# Patient Record
Sex: Male | Born: 1955 | Race: White | Hispanic: No | Marital: Married | Smoking: Never smoker
Health system: Southern US, Community
[De-identification: ages and names within clinical notes are randomized; demographics above are authoritative.]

## PROBLEM LIST (undated history)

## (undated) HISTORY — PX: SHOULDER ARTHROSCOPY: SHX128

## (undated) HISTORY — PX: BACK SURGERY: SHX140

## (undated) HISTORY — PX: FRACTURE SURGERY: SHX138

---

## 2016-03-14 ENCOUNTER — Encounter: Payer: Self-pay | Admitting: Emergency Medicine

## 2016-03-14 ENCOUNTER — Emergency Department
Admission: EM | Admit: 2016-03-14 | Discharge: 2016-03-14 | Disposition: A | Discharge status: Home / Self Care | Payer: Managed Care, Other (non HMO) | Attending: Emergency Medicine | Admitting: Emergency Medicine

## 2016-03-14 ENCOUNTER — Ambulatory Visit (INDEPENDENT_AMBULATORY_CARE_PROVIDER_SITE_OTHER): Payer: Managed Care, Other (non HMO)

## 2016-03-14 ENCOUNTER — Ambulatory Visit (INDEPENDENT_AMBULATORY_CARE_PROVIDER_SITE_OTHER)
Admission: EM | Admit: 2016-03-14 | Discharge: 2016-03-14 | Disposition: A | Discharge status: Home / Self Care | Payer: Managed Care, Other (non HMO) | Source: Home / Self Care | Attending: Emergency Medicine | Admitting: Emergency Medicine

## 2016-03-14 DIAGNOSIS — F10230 Alcohol dependence with withdrawal, uncomplicated: Secondary | ICD-10-CM | POA: Diagnosis not present

## 2016-03-14 DIAGNOSIS — M5137 Other intervertebral disc degeneration, lumbosacral region: Secondary | ICD-10-CM

## 2016-03-14 DIAGNOSIS — D696 Thrombocytopenia, unspecified: Secondary | ICD-10-CM

## 2016-03-14 DIAGNOSIS — F10930 Alcohol use, unspecified with withdrawal, uncomplicated: Secondary | ICD-10-CM

## 2016-03-14 DIAGNOSIS — I159 Secondary hypertension, unspecified: Secondary | ICD-10-CM

## 2016-03-14 DIAGNOSIS — K709 Alcoholic liver disease, unspecified: Secondary | ICD-10-CM | POA: Diagnosis not present

## 2016-03-14 DIAGNOSIS — K7010 Alcoholic hepatitis without ascites: Secondary | ICD-10-CM | POA: Insufficient documentation

## 2016-03-14 DIAGNOSIS — R202 Paresthesia of skin: Secondary | ICD-10-CM | POA: Diagnosis present

## 2016-03-14 LAB — CBC WITH DIFFERENTIAL/PLATELET
Basophils Absolute: 0 10*3/uL (ref 0–0.1)
Basophils Relative: 0 %
Eosinophils Absolute: 0.1 10*3/uL (ref 0–0.7)
Eosinophils Relative: 1 %
HCT: 47.6 % (ref 40.0–52.0)
Hemoglobin: 16.2 g/dL (ref 13.0–18.0)
Lymphocytes Relative: 10 %
Lymphs Abs: 0.8 10*3/uL — ABNORMAL LOW (ref 1.0–3.6)
MCH: 34.1 pg — ABNORMAL HIGH (ref 26.0–34.0)
MCHC: 34 g/dL (ref 32.0–36.0)
MCV: 100 fL (ref 80.0–100.0)
Monocytes Absolute: 0.7 10*3/uL (ref 0.2–1.0)
Monocytes Relative: 8 %
Neutro Abs: 6.9 10*3/uL — ABNORMAL HIGH (ref 1.4–6.5)
Neutrophils Relative %: 81 %
Platelets: 104 10*3/uL — ABNORMAL LOW (ref 150–440)
RBC: 4.76 MIL/uL (ref 4.40–5.90)
RDW: 12.9 % (ref 11.5–14.5)
WBC: 8.5 10*3/uL (ref 3.8–10.6)

## 2016-03-14 LAB — COMPREHENSIVE METABOLIC PANEL
ALT: 308 U/L — ABNORMAL HIGH (ref 17–63)
AST: 593 U/L — ABNORMAL HIGH (ref 15–41)
Albumin: 4.9 g/dL (ref 3.5–5.0)
Alkaline Phosphatase: 94 U/L (ref 38–126)
Anion gap: 12 (ref 5–15)
BUN: 13 mg/dL (ref 6–20)
CO2: 24 mmol/L (ref 22–32)
Calcium: 9.9 mg/dL (ref 8.9–10.3)
Chloride: 97 mmol/L — ABNORMAL LOW (ref 101–111)
Creatinine, Ser: 0.78 mg/dL (ref 0.61–1.24)
GFR calc Af Amer: >60 mL/min (ref >60)
GFR calc non Af Amer: >60 mL/min (ref >60)
Glucose, Bld: 132 mg/dL — ABNORMAL HIGH (ref 65–99)
Potassium: 3.6 mmol/L (ref 3.5–5.1)
Sodium: 133 mmol/L — ABNORMAL LOW (ref 135–145)
Total Bilirubin: 3 mg/dL — ABNORMAL HIGH (ref 0.3–1.2)
Total Protein: 9.4 g/dL — ABNORMAL HIGH (ref 6.5–8.1)

## 2016-03-14 LAB — SEDIMENTATION RATE: Sed Rate: 2 mm/hr (ref 0–20)

## 2016-03-14 MED ORDER — KETOROLAC TROMETHAMINE 60 MG/2ML IM SOLN
60.0000 mg | Freq: Once | INTRAMUSCULAR | Status: AC
  Administered 2016-03-14: 60 mg via INTRAMUSCULAR

## 2016-03-14 MED ORDER — SODIUM CHLORIDE 0.9 % IV SOLN
1000.0000 mL | Freq: Once | INTRAVENOUS | Status: AC
  Administered 2016-03-14: 1000 mL via INTRAVENOUS

## 2016-03-14 MED ORDER — LORAZEPAM 1 MG PO TABS: 1.0000 mg | ORAL_TABLET | Freq: Three times a day (TID) | ORAL | Status: AC | PRN

## 2016-03-14 MED ORDER — LORAZEPAM 2 MG/ML IJ SOLN
1.0000 mg | Freq: Once | INTRAMUSCULAR | Status: AC
  Administered 2016-03-14: 1 mg via INTRAVENOUS
  Filled 2016-03-14: qty 1

## 2016-03-14 NOTE — ED Notes (Signed)
Pt states he went to the [facility] urgent care today and was seen for back pain and BL LE pain/tingling.. States his liver enzymes where elevated and was referred to the ED for possible admission.. Pt is here with his wife who states they discussed pt has a problem with alcohol and wants help.. States drinks 6-12 or more beers/day.Mario Harris..Mario Harris

## 2016-03-14 NOTE — ED Provider Notes (Signed)
[facility] Emergency Department Provider Note  ____________________________________________    I have reviewed the triage vital signs and the nursing notes.   HISTORY  Chief Complaint Abnormal Lab and Alcohol Problem    HPI Kayshawn Yero is a 60 y.o. male who was sent from urgent care for elevated liver enzymes. Patient reports he was in the car today and felt shaky, he stopped that urgent care and they checked his labs and discovered elevated AST and ALT. Patient admits to drinking 6-12 beers per night for some time. No edema or abdominal swelling. No fever or chills nausea or vomiting or abdominal pain. Patient does request resources for his alcohol detox.   History reviewed. No pertinent past medical history.  There are no active problems to display for this patient.   Past Surgical History  Procedure Laterality Date  . Back surgery    . Shoulder arthroscopy    . Fracture surgery      No current outpatient prescriptions on file.  Allergies Review of patient's allergies indicates no known allergies.  No family history on file.  Social History Social History  Substance Use Topics  . Smoking status: Never Smoker   . Smokeless tobacco: None  . Alcohol Use: 7.2 oz/week    12 Cans of beer per week    Review of Systems  Constitutional: Negative for fever. Eyes: Negative for redness ENT: Negative for sore throat Cardiovascular: Negative for chest pain Respiratory: Negative for shortness of breath. Gastrointestinal: Negative for abdominal pain Genitourinary: Negative for dysuria. Musculoskeletal: Negative for back pain. Skin: Negative for rash. Neurological: Negative for focal weakness Psychiatric:Positive for anxiety  ____________________________________________   PHYSICAL EXAM:  VITAL SIGNS: ED Triage Vitals  Enc Vitals Group     BP 03/14/16 1051 159/100 mmHg     Pulse Rate 03/14/16 1051 104     Resp 03/14/16 1051 16     Temp  03/14/16 1051 99.1 F (37.3 C)     Temp Source 03/14/16 1051 Oral     SpO2 03/14/16 1051 98 %     Weight 03/14/16 1051 198 lb (89.812 kg)     Height 03/14/16 1051  (1.803 m)     Head Cir --      Peak Flow --      Pain Score 03/14/16 1051 7     Pain Loc --      Pain Edu? --      Excl. in GC? --      Constitutional: Alert and oriented. Well appearing and in no distress.  Eyes: Conjunctivae are normal. No erythema or injection ENT   Head: Normocephalic and atraumatic.   Mouth/Throat: Mucous membranes are moist. Cardiovascular: Normal rate, regular rhythm. Normal and symmetric distal pulses are present in the upper extremities.  Respiratory: Normal respiratory effort without tachypnea nor retractions.  Gastrointestinal: Soft and non-tender in all quadrants. No distention. There is no CVA tenderness. Genitourinary: deferred Musculoskeletal: Nontender with normal range of motion in all extremities. No lower extremity tenderness nor edema. Neurologic:  Normal speech and language. No gross focal neurologic deficits are appreciated. Skin:  Skin is warm, dry and intact. No rash noted. Psychiatric: Mood and affect are normal. Patient exhibits appropriate insight and judgment.  ____________________________________________    LABS (pertinent positives/negatives)  Labs Reviewed - No data to display  ____________________________________________   EKG  None  ____________________________________________    RADIOLOGY  None  ____________________________________________   PROCEDURES  Procedure(s) performed: none  Critical Care  performed: none  ____________________________________________   INITIAL IMPRESSION / ASSESSMENT AND PLAN / ED COURSE  Pertinent labs & imaging results that were available during my care of the patient were reviewed by me and considered in my medical decision making (see chart for details).  Patient overall well-appearing and in no  distress. He may be suffering from some mild alcohol withdrawal given his tachycardia and shakiness. He had significant improvement after 1 mg of Ativan and IV fluids. I discussed his chemistry results with Dr. Bluford Kaufmannh of gastroenterology who reports outpatient follow-up is appropriate for his elevated LFTs. We've also consulted TTS to discuss resources for the patient  ____________________________________________   FINAL CLINICAL IMPRESSION(S) / ED DIAGNOSES  Alcoholic hepatitis Alcohol withdrawal       Jene Everyobert Angeletta Goelz, MD 03/14/16 1512

## 2016-03-14 NOTE — ED Notes (Addendum)
TTS computer on at bedside at this time, pt and family instructed on proper use and verbalized understanding

## 2016-03-14 NOTE — ED Provider Notes (Signed)
CSN: [id number]     Arrival date & time 03/14/16  0809 History   First MD Initiated Contact with Patient 03/14/16 [phone]     Chief Complaint  Patient presents with  . Back Pain   (Consider location/radiation/quality/duration/timing/severity/associated sxs/prior Treatment) HPI  60 year old male who is accompanied by his wife and complaning of low back pain that he has had for approximately 3 weeks. He initially fell on some steps hitting his buttocks 3 weeks ago which seemed to start the back pain. Has worsened. He and his wife just went on a long car trip to [place] and while in [place] sat for long periods of time where he visited friends. On the trip back, he states that one time he got out on a rest stop in [place] and had difficulty walking. He states it is not from weakness but more from coordination because of how his posturing was. He denies any incontinence. 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  After the Toradol injection I went to see how his pain had decreased with the use of the Toradol and brought up the fact that his presentation does not fit the complaints. After conversation with both the wife and the patient I it was discovered that he has been drinking very heavily sometimes surreptitiously but he and his wife had a long talk on their trip back from [place] about his alcoholic use and how is affecting his life and their lives together. She states that this is the first time that they have actually talked over the subject ; she has noticed a great deal of weight loss and shakiness which he exhibits today. States that while in [place] he would drink upwards of a case of beer daily. This has  been going on for quite some time.     History reviewed. No pertinent past medical history. Past Surgical History  Procedure Laterality Date  . Back surgery     No family history on file. Social  History  Substance Use Topics  . Smoking status: Never Smoker   . Smokeless tobacco: None  . Alcohol Use: Yes    Review of Systems  Allergies  Review of patient's allergies indicates no known allergies.  Home Medications   Prior to Admission medications   Not on File   Meds Ordered and Administered this Visit   Medications  ketorolac (TORADOL) injection 60 mg (60 mg Intramuscular Given 03/14/16 0903)    BP 161/111 mmHg  Pulse 116  Temp(Src) 96.7 F (35.9 C) (Tympanic)  Resp 18  Ht  (1.803 m)  Wt 198 lb (89.812 kg)  BMI 27.63 kg/m2  SpO2 99% No data found.   Physical Exam  Constitutional: He is oriented to person, place, and time. He appears well-developed and well-nourished. No distress.  HENT:  Head: Normocephalic and atraumatic.  Eyes: Conjunctivae and EOM are normal. Pupils are equal, round, and reactive to light.  Neck: Normal range of motion. Neck supple.  Musculoskeletal: He exhibits tenderness.  Exam nation of the lumbar spine shows the patient slightly flexed posture. He constantly wants to hold onto things while standing. Lateral flexion shows some blunting of the lumbar segments with some discomfort especially to right lateral flexion. With assistance, the patient is able to toe and heel walk but is very unsteady in his gait. DTRs in the lower extremities shows a hyperreflexic left knee jerk in comparison to  the right which is 2+ over 4 left being 3+ over 4. No clonus is present. Ankle reflexes are normal bilaterally.EHL, anterior tibialis and peroneal are strong to clinical testing. Sensation is intact to light touch throughout. Straight leg raise testing in the sitting position is negative on the left and a positive on the right with lateral calf complaints.  Examination of the cervical spine shows a good range of motion of the cervical spine that is comfortable. Extremity strength is intact to clinical testing. DTRs are 2+ over 4 and symmetrical. Cranial  nerves are intact to clinical testing.  Neurological: He is alert and oriented to person, place, and time. He displays abnormal reflex. No cranial nerve deficit. He exhibits normal muscle tone. Coordination abnormal.  Skin: Skin is warm and dry. He is not diaphoretic.  Psychiatric: He has a normal mood and affect. His behavior is normal. Judgment and thought content normal.  Nursing note and vitals reviewed.   ED Course  Procedures (including critical care time)  Labs Review Labs Reviewed  CBC WITH DIFFERENTIAL/PLATELET - Abnormal; Notable for the following:    MCH 34.1 (*)    Platelets 104 (*)    Neutro Abs 6.9 (*)    Lymphs Abs 0.8 (*)    All other components within normal limits  COMPREHENSIVE METABOLIC PANEL - Abnormal; Notable for the following:    Sodium 133 (*)    Chloride 97 (*)    Glucose, Bld 132 (*)    Total Protein 9.4 (*)    AST 593 (*)    ALT 308 (*)    Total Bilirubin 3.0 (*)    All other components within normal limits  SEDIMENTATION RATE    Imaging Review Dg Lumbar Spine Complete  03/14/2016  CLINICAL DATA:  Lumbago for 3 months. Fall 2 weeks prior. Relative numbness both lower extremities. EXAM: LUMBAR SPINE - COMPLETE 4+ VIEW COMPARISON:  None. FINDINGS: Frontal, lateral, spot lumbosacral lateral, and bilateral oblique views were obtained. There are 5 non-rib-bearing lumbar type vertebral bodies. There is no fracture or spondylolisthesis. There is moderate disc space narrowing at L3-4, L4-5, and L5-S1. There is also moderate disc space narrowing at T10-11 and T11-12. There are anterior osteophytes at multiple levels, largest at L4 and L5. There is facet osteoarthritic change at multiple levels bilaterally, most notably at L5-S1 bilaterally. IMPRESSION: Multilevel osteoarthritic change.  No fracture or spondylolisthesis. Electronically Signed   By: Bretta Bang III M.D.   On: 03/14/2016 09:43     Visual Acuity Review  Right Eye Distance:   Left Eye  Distance:   Bilateral Distance:    Right Eye Near:   Left Eye Near:    Bilateral Near:     Medications  ketorolac (TORADOL) injection 60 mg (60 mg Intramuscular Given 03/14/16 0903)      MDM   1. DDD (degenerative disc disease), lumbosacral   2. ALD (alcoholic liver disease) (HCC)   3. Secondary hypertension, unspecified   4. Thrombocytopenia [facility])   Long talk with the wife and the patient. His lumbar pain is probably from degenerative disc disease. However his more problematic symptom at the present time is that of the alcoholic liver disease. Patient states that he is ready for intervention and is willing to stop drinking which is absolutely essential. Because of his elevated liver transaminases I have recommended that he be seen in the emergency room for further evaluation and treatment. They are in agreement and wish to go to [facility]. Wife will  transport him by private vehicle. I contacted Tammy SoursGreg ,the charge nurse, and they gave him a report on the patient. He left our department in stable condition.     Lutricia FeilWilliam P Izabel Chim, PA-C 03/14/16 1016.edm  Lutricia FeilWilliam P Phoebe Marter, PA-C 03/14/16 1028

## 2016-03-14 NOTE — [facility] Assessment (Signed)
[facility] Assessment Progress Note  Spoke with Dr. Cyril LoosenKinner, EDP, who stated he does not want a full evaluation, just for someone to talk with the pt about detox treatment options. Gave information, resources and supports to family, spoke through teleassessment. Family will follow up.

## 2016-03-14 NOTE — Discharge Instructions (Signed)
Alcoholic Liver Disease  The liver is an organ that converts food into energy, absorbs vitamins from food, removes toxins from the blood, and makes proteins. Alcoholic liver disease happens when the liver becomes damaged due to alcohol consumption and it stops working properly.  CAUSES  This condition is caused by drinking too much alcohol over a number of years.  RISK FACTORS  This condition is more likely to develop in:  · Women.  · People who have a family history of the disease.  · People who have poor nutrition.  · People who are obese.  SYMPTOMS  Early symptoms of this condition include:  · Fatigue.  · Weakness.  · Abdominal discomfort on the upper right side.  Symptoms of moderate disease include:  · Fever.  · Yellow, pale, or darkening skin.  · Abdominal pain.  · Nausea and vomiting.  · Weight loss.  · Loss of appetite.  Symptoms of advanced disease include:  · Abdominal swelling.  · Nosebleeds or bleeding gums.  · Itchy skin.  · Enlarged fingertips (clubbing).  · Light-colored stools that smell very bad (steatorrhea).  · Mood changes.  · Feeling agitated.  · Confusion.  · Trouble concentrating.  Some people do not have symptoms until the condition becomes severe. Symptoms are often worse after heavy drinking.  DIAGNOSIS  This condition is diagnosed with:  · A physical exam.  · Blood tests.  · Taking a sample of liver tissue to examine under a microscope (liver biopsy).  You may also have other tests, including:   · X-rays.  · Ultrasound.  TREATMENT  Treatment may include:  · Stopping alcohol use to allow the liver to heal.  · Joining a support group or meeting with a counselor.  · Medicines to reduce inflammation. These may be recommended if the disease is severe.  · A liver transplant. This is the only treatment if the disease is very severe.  · Nutritional therapy. This may involve:    Taking vitamins.    Eating foods that are high in thiamine, such as whole-wheat cereals, pork, and raw vegetables.    Eating foods that have a lot of folic acid, such as vegetables, fruits, meats, beans, nuts, and dairy foods.    Eating a diet that includes carbohydrate-rich foods, such as yogurt, beans, potatoes, and rice.  HOME CARE INSTRUCTIONS  · Do not drink alcohol.  · Take medicines only as directed by your health care provider.  · Take vitamins only as directed by your health care provider.  · Follow any diet instructions that are given to you by your health care provider.  SEEK MEDICAL CARE IF:  · You have a fever.  · You have shortness of breath or have difficulty breathing.  · You have bright red blood in your stool, or you have black, tarry stools.  · You are vomiting blood.  · Your skin color becomes more yellow, pale, or dark.  · You develop headaches.  · You have trouble thinking.  · You have problems balancing or walking.     This information is not intended to replace advice given to you by your health care provider. Make sure you discuss any questions you have with your health care provider.     Document Released: 11/04/2014 Document Reviewed: 11/04/2014  Elsevier Interactive Patient Education ©2016 Elsevier Inc.

## 2016-03-14 NOTE — Discharge Instructions (Signed)
Alcoholic Liver Disease The liver is an organ that converts food into energy, absorbs vitamins from food, removes toxins from the blood, and makes proteins. Alcoholic liver disease happens when the liver becomes damaged due to alcohol consumption and it stops working properly. CAUSES This condition is caused by drinking too much alcohol over a number of years. RISK FACTORS This condition is more likely to develop in:  Women.  People who have a family history of the disease.  People who have poor nutrition.  People who are obese. SYMPTOMS Early symptoms of this condition include:  Fatigue.  Weakness.  Abdominal discomfort on the upper right side. Symptoms of moderate disease include:  Fever.  Yellow, pale, or darkening skin.  Abdominal pain.  Nausea and vomiting.  Weight loss.  Loss of appetite. Symptoms of advanced disease include:  Abdominal swelling.  Nosebleeds or bleeding gums.  Itchy skin.  Enlarged fingertips (clubbing).  Light-colored stools that smell very bad (steatorrhea).  Mood changes.  Feeling agitated.  Confusion.  Trouble concentrating. Some people do not have symptoms until the condition becomes severe. Symptoms are often worse after heavy drinking. DIAGNOSIS This condition is diagnosed with:  A physical exam.  Blood tests.  Taking a sample of liver tissue to examine under a microscope (liver biopsy). You may also have other tests, including:   X-rays.  Ultrasound. TREATMENT Treatment may include:  Stopping alcohol use to allow the liver to heal.  Joining a support group or meeting with a counselor.  Medicines to reduce inflammation. These may be recommended if the disease is severe.  A liver transplant. This is the only treatment if the disease is very severe.  Nutritional therapy. This may involve:  Taking vitamins.  Eating foods that are high in thiamine, such as whole-wheat cereals, pork, and raw  vegetables.  Eating foods that have a lot of folic acid, such as vegetables, fruits, meats, beans, nuts, and dairy foods.  Eating a diet that includes carbohydrate-rich foods, such as yogurt, beans, potatoes, and rice. HOME CARE INSTRUCTIONS  Do not drink alcohol.  Take medicines only as directed by your health care provider.  Take vitamins only as directed by your health care provider.  Follow any diet instructions that are given to you by your health care provider. SEEK MEDICAL CARE IF:  You have a fever.  You have shortness of breath or have difficulty breathing.  You have bright red blood in your stool, or you have black, tarry stools.  You are vomiting blood.  Your skin color becomes more yellow, pale, or dark.  You develop headaches.  You have trouble thinking.  You have problems balancing or walking.   This information is not intended to replace advice given to you by your health care provider. Make sure you discuss any questions you have with your health care provider.   Document Released: 11/04/2014 Document Reviewed: 11/04/2014 Elsevier Interactive Patient Education 2016 [employer]  Alcohol Withdrawal Alcohol withdrawal is a group of symptoms that can develop when a person who drinks heavily and regularly stops drinking or drinks less. CAUSES Heavy and regular drinking can cause chemicals that send signals from the brain to the body (neurotransmitters) to deactivate. Alcohol withdrawal develops when deactivated neurotransmitters reactivate because a person stops drinking or drinks less. RISK FACTORS The more a person drinks and the longer he or she drinks, the greater the risk of alcohol withdrawal. Severe withdrawal is more likely to develop in someone who:  Had severe alcohol withdrawal  in the past.  Had a seizure during a previous episode of alcohol withdrawal.  Is elderly.  Is pregnant.  Has been abusing drugs.  Has other medical problems,  including:  Infection.  Heart, lung, or liver disease.  Seizures.  Mental health problems. SYMPTOMS Symptoms of this condition can be mild to moderate, or they can be severe. Mild to moderate symptoms may include:  Fatigue.  Nightmares.  Trouble sleeping.  Depression.  Anxiety.  Inability to think clearly.  Mood swings.  Irritability.  Loss of appetite.  Nausea or vomiting.  Clammy skin.  Extreme sweating.  Rapid heartbeat.  Shakiness.  Uncontrollable shaking (tremor). Severe symptoms may include:  Fever.  Seizures.  Severeconfusion.  Feeling or seeing things that are not there (hallucinations). Symptoms usually begin within eight hours after a person stops drinking or drinks less. They can last for weeks. DIAGNOSIS Alcohol withdrawal is diagnosed with a medical history and physical exam. Sometimes, urine and blood tests are also done. TREATMENT Treatment may involve:  Monitoring blood pressure, pulse, and breathing.  Getting fluids through an IV tube.  Medicine to reduce anxiety.  Medicine to prevent or control seizures.  Multivitamins and B vitamins.  Having a health care provider check on you daily. If symptoms are moderate to severe or if there is a risk of severe withdrawal, treatment may be done at a hospital or treatment center. HOME CARE INSTRUCTIONS  Take medicines and vitamin supplements only as directed by your health care provider.  Do not drink alcohol.  Have someone stay with you or be available if you need help.  Drink enough fluid to keep your urine clear or pale yellow.  Consider joining a 12-step program or another alcohol support group. SEEK MEDICAL CARE IF:  Your symptoms get worse or do not go away.  You cannot keep food or water in your stomach.  You are struggling with not drinking alcohol.  You cannot stop drinking alcohol. SEEK IMMEDIATE MEDICAL CARE IF:   You have an irregular heartbeat.  You have  chest pain.  You have trouble breathing.  You have symptoms of severe withdrawal, such as:  A fever.  Seizures.  Severe confusion.  Hallucinations.   This information is not intended to replace advice given to you by your health care provider. Make sure you discuss any questions you have with your health care provider.   Document Released: 07/24/2005 Document Revised: 11/04/2014 Document Reviewed: 08/02/2014 Elsevier Interactive Patient Education [employer]

## 2016-03-14 NOTE — ED Notes (Signed)
Pt with low back pain for about two mths. Now reports hands and feet tingling. Pt with hx of back surgery and does not have pcp in the area. Has been taking OTC meds for pain with no relief.

## 2016-03-20 ENCOUNTER — Other Ambulatory Visit: Payer: Self-pay | Admitting: Gastroenterology

## 2016-03-20 DIAGNOSIS — F1010 Alcohol abuse, uncomplicated: Secondary | ICD-10-CM

## 2016-03-20 DIAGNOSIS — R7989 Other specified abnormal findings of blood chemistry: Secondary | ICD-10-CM

## 2016-03-20 DIAGNOSIS — R945 Abnormal results of liver function studies: Secondary | ICD-10-CM

## 2016-03-26 ENCOUNTER — Ambulatory Visit
Admission: RE | Admit: 2016-03-26 | Discharge: 2016-03-26 | Disposition: A | Discharge status: Home / Self Care | Payer: Managed Care, Other (non HMO) | Source: Ambulatory Visit | Attending: Gastroenterology | Admitting: Gastroenterology

## 2016-03-26 DIAGNOSIS — F1010 Alcohol abuse, uncomplicated: Secondary | ICD-10-CM | POA: Diagnosis present

## 2016-03-26 DIAGNOSIS — K760 Fatty (change of) liver, not elsewhere classified: Secondary | ICD-10-CM | POA: Diagnosis not present

## 2016-03-26 DIAGNOSIS — R945 Abnormal results of liver function studies: Secondary | ICD-10-CM

## 2016-03-26 DIAGNOSIS — R7989 Other specified abnormal findings of blood chemistry: Secondary | ICD-10-CM | POA: Diagnosis present

## 2017-03-13 IMAGING — CR DG LUMBAR SPINE COMPLETE 4+V
5 series · 6 of 6 positions shown · non-contrast
Comparison: None.

CLINICAL DATA: Lumbago for 3 months. Fall 2 weeks prior. Relative
numbness both lower extremities.

EXAM:
LUMBAR SPINE - COMPLETE 4+ VIEW

[l-spine ap]
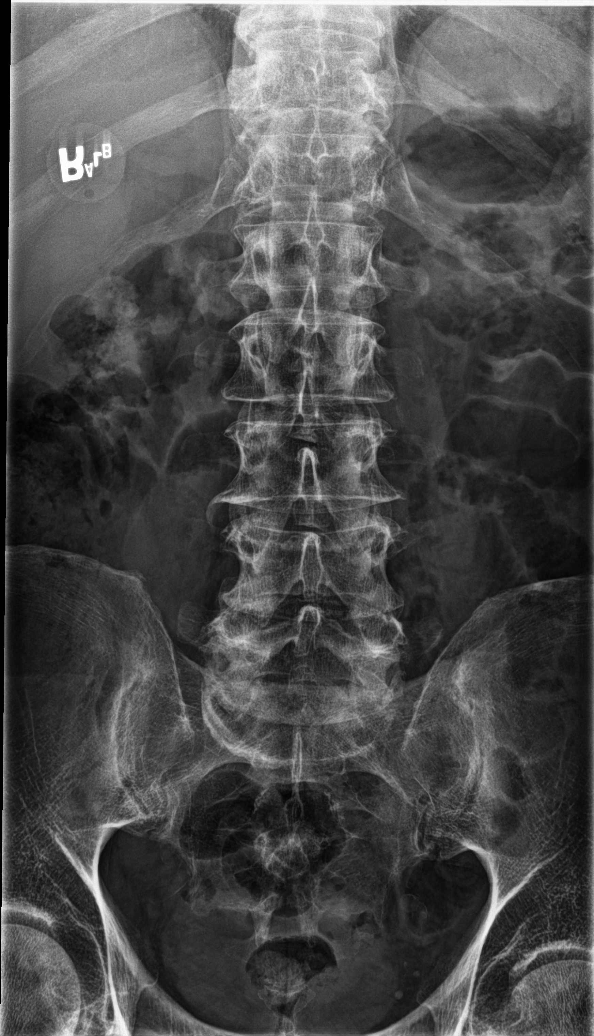

[l-spine obl (1 of 2)]
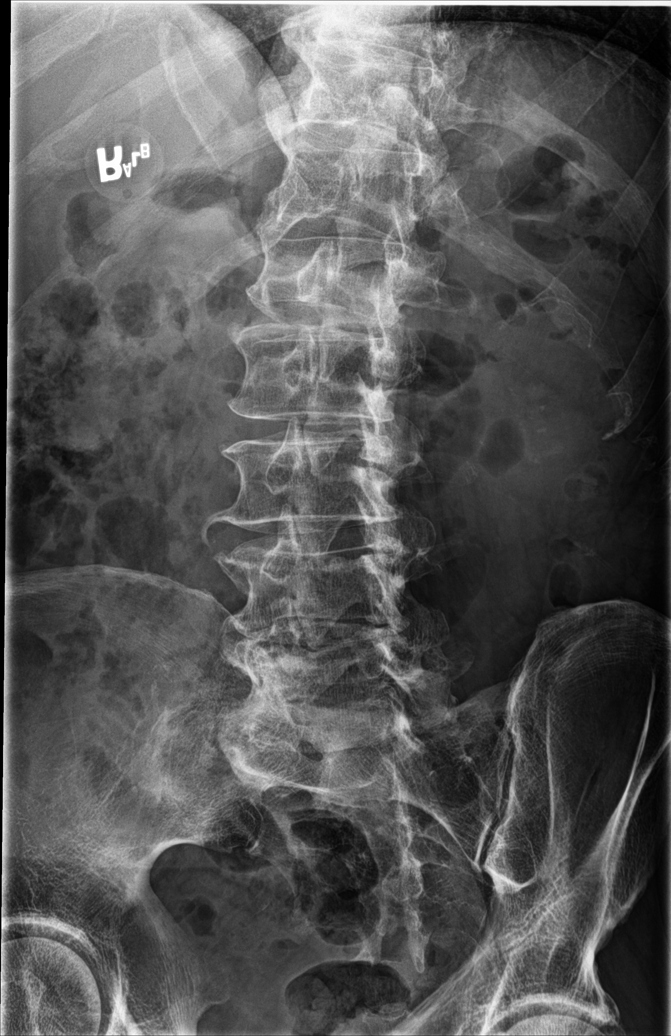

[Series 3: l-spine obl · 0.14mm/px · 2 of 2 slices shown (2 of 2)]
[im 1/2]
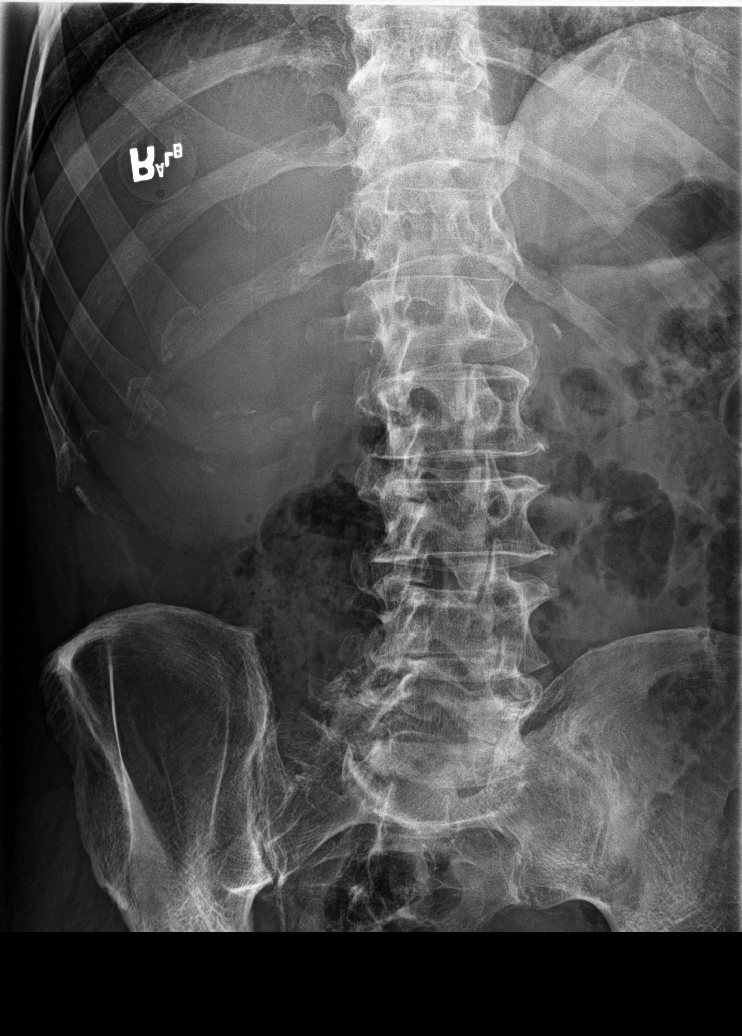
[im 2/2]
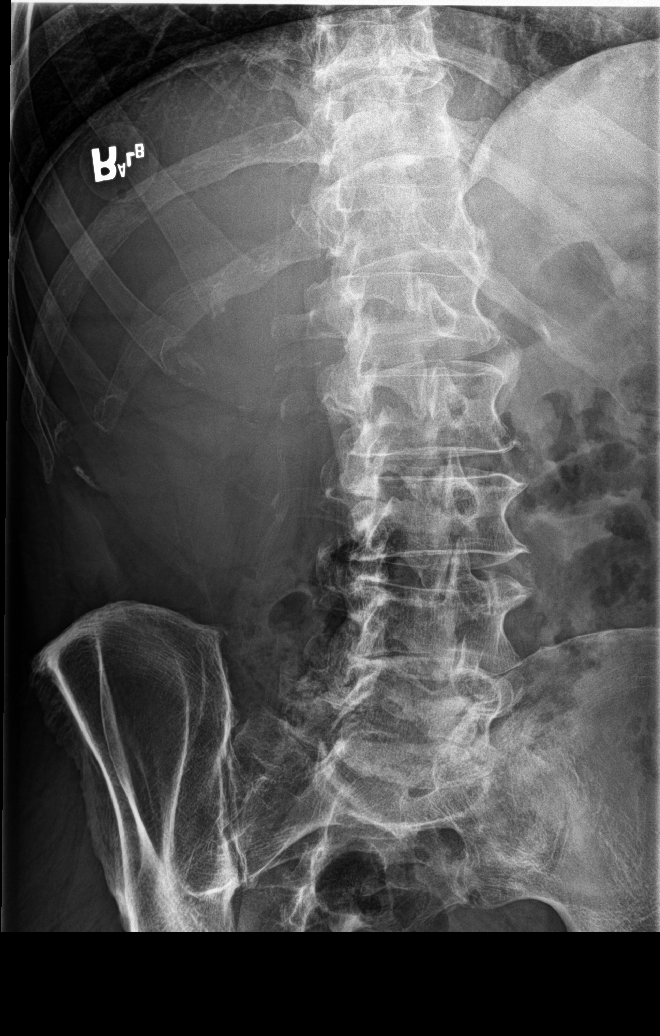

[l-spine lat]
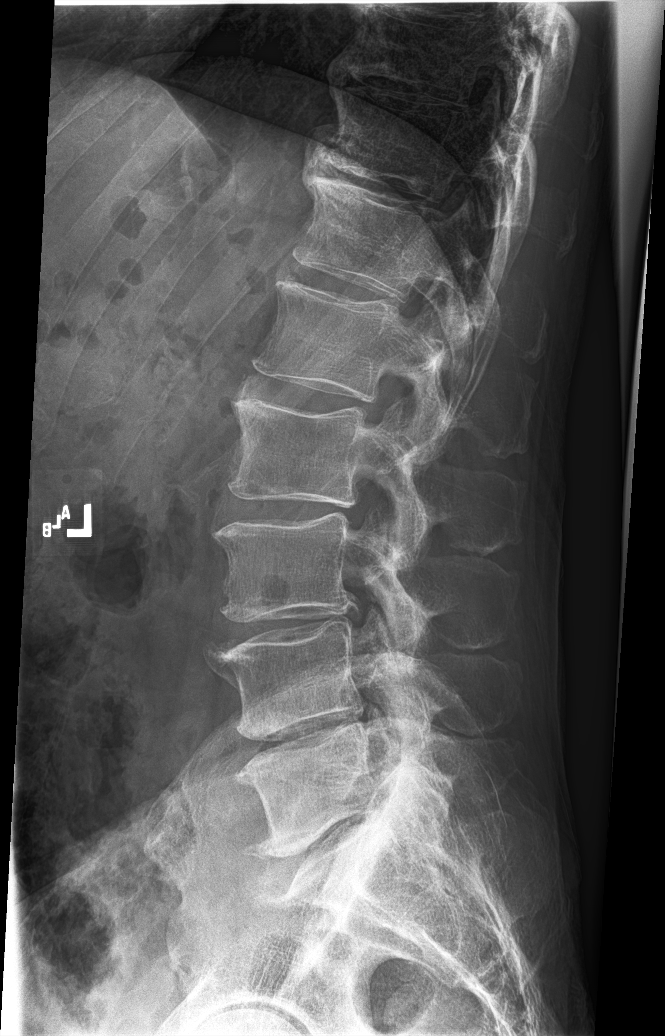

[l-spine spot]
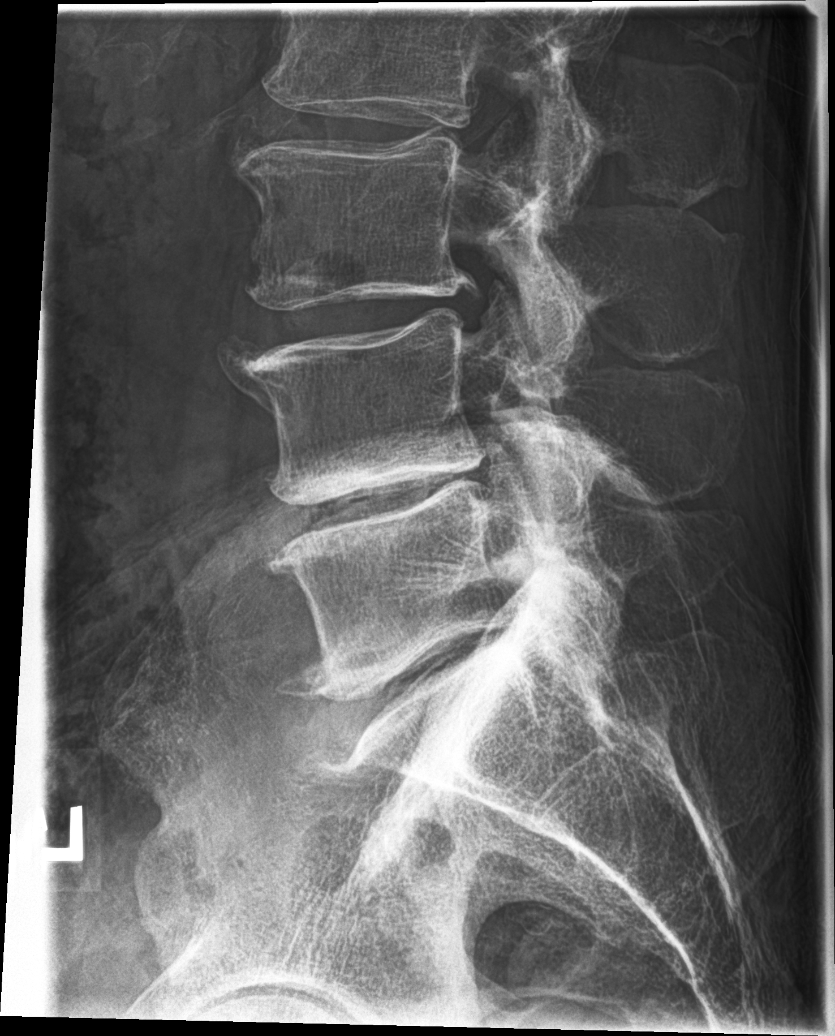

[6 of 6 positions shown; findings below may reference images not displayed]

FINDINGS: Frontal, lateral, spot lumbosacral lateral, and bilateral oblique
views were obtained. There are 5 non-rib-bearing lumbar type
vertebral bodies. There is no fracture or spondylolisthesis. There
is moderate disc space narrowing at L3-4, L4-5, and L5-S1. There is
also moderate disc space narrowing at T10-11 and T11-12. There are
anterior osteophytes at multiple levels, largest at L4 and L5. There
is facet osteoarthritic change at multiple levels bilaterally, most
notably at L5-S1 bilaterally.
IMPRESSION: Multilevel osteoarthritic change.  No fracture or spondylolisthesis.

## 2017-07-13 IMAGING — US US ABDOMEN LIMITED
1 series · 14 of 25 positions shown · non-contrast
Comparison: No prior.

CLINICAL DATA: Abnormal LFTs.

EXAM:
US ABDOMEN LIMITED - RIGHT UPPER QUADRANT

[Series 1: us abdomen limited · 0.23mm/px · 14 of 57 slices shown]
[im 1/57]
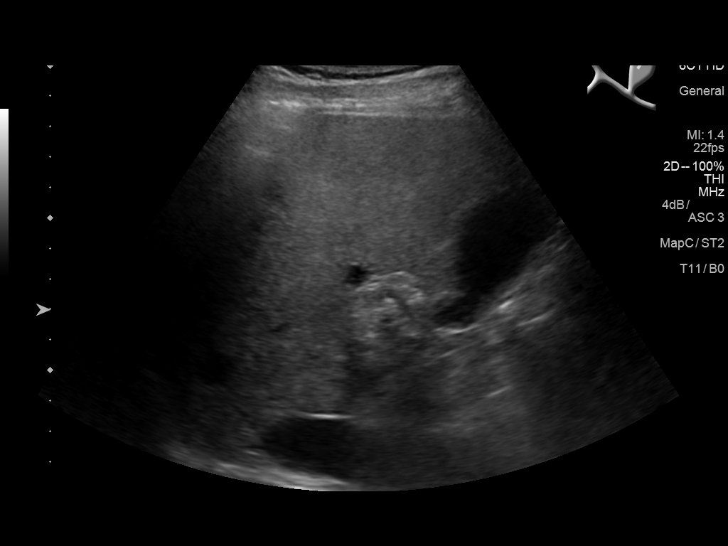
[im 5/57]
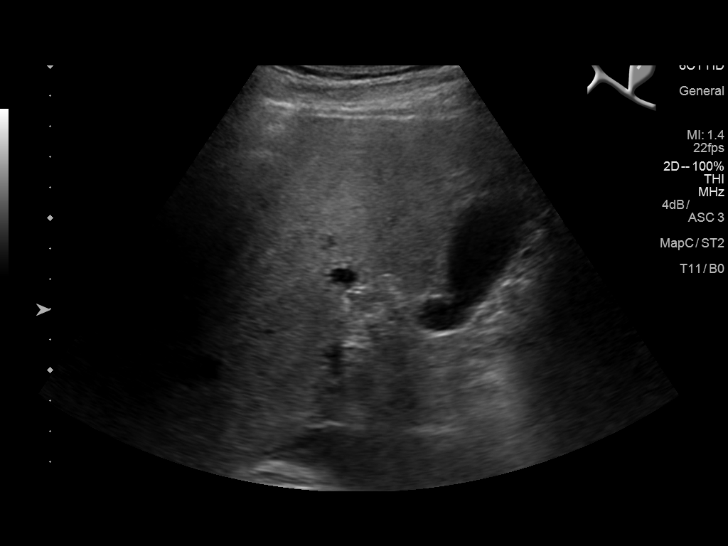
[im 10/57]
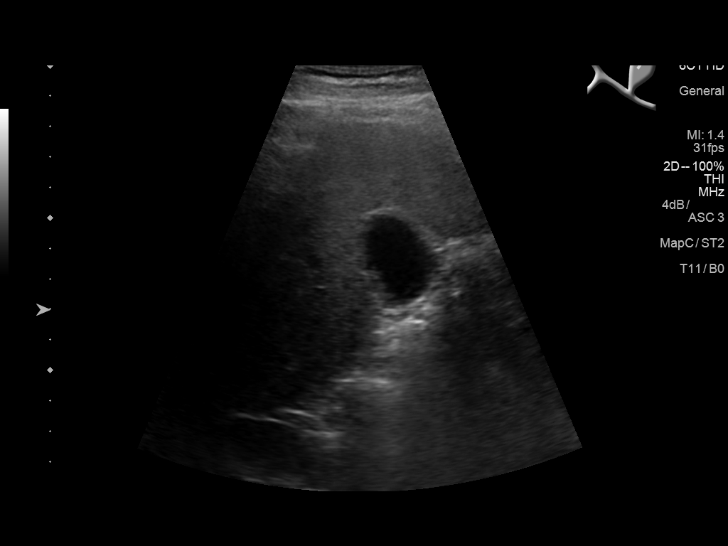
[im 15/57]
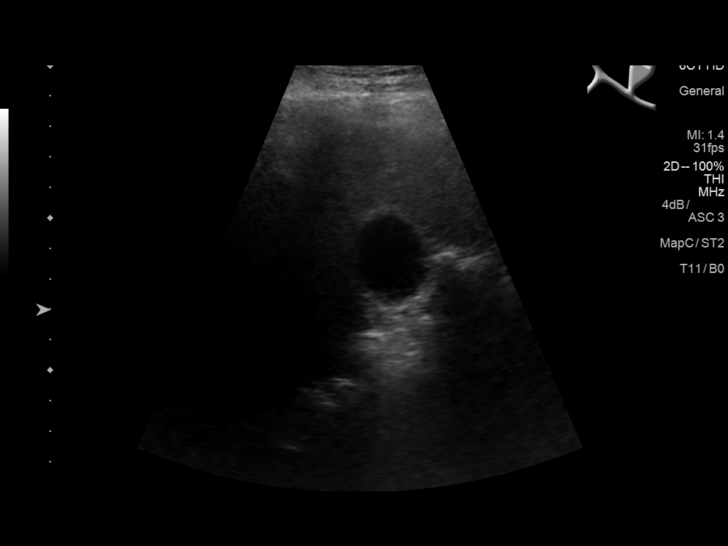
[im 19/57]
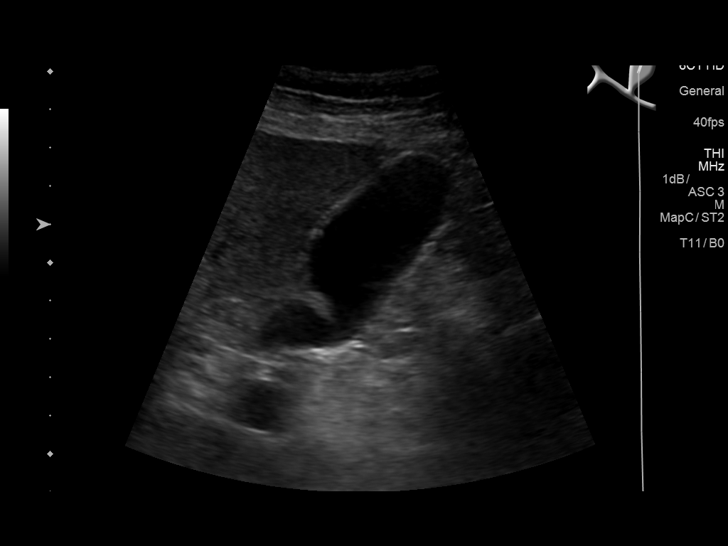
[im 22/57]
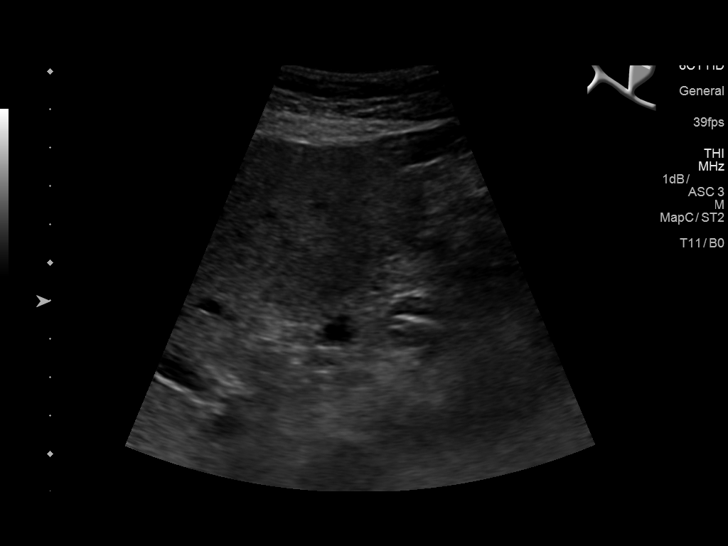
[im 26/57]
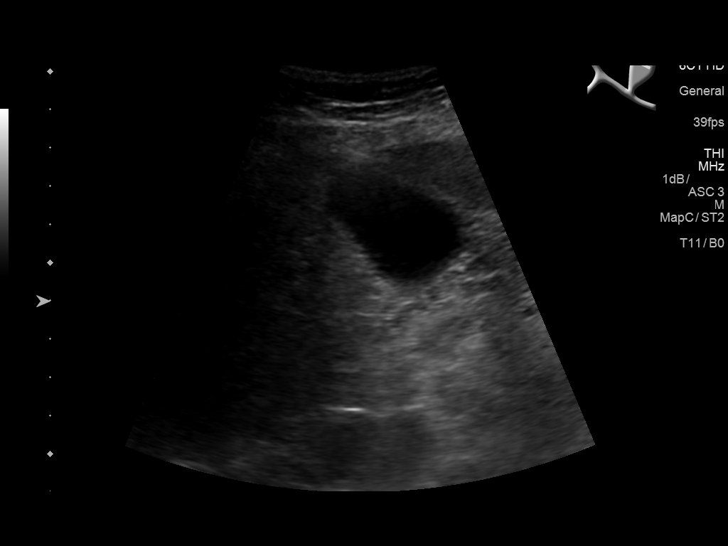
[im 31/57]
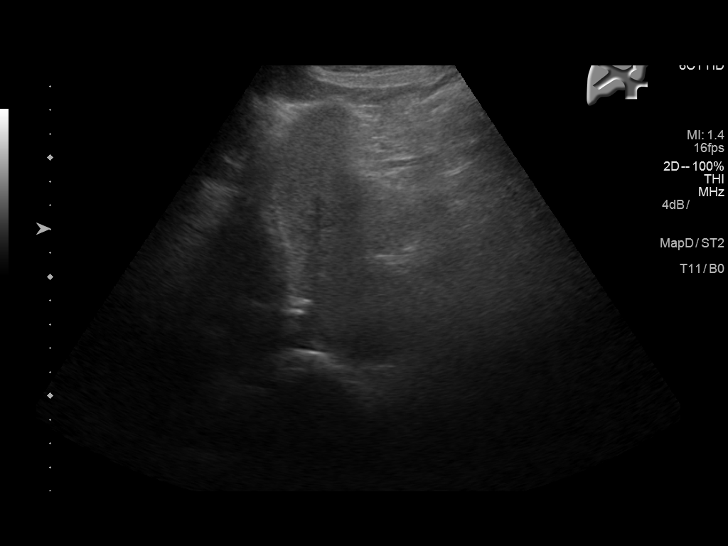
[im 36/57]
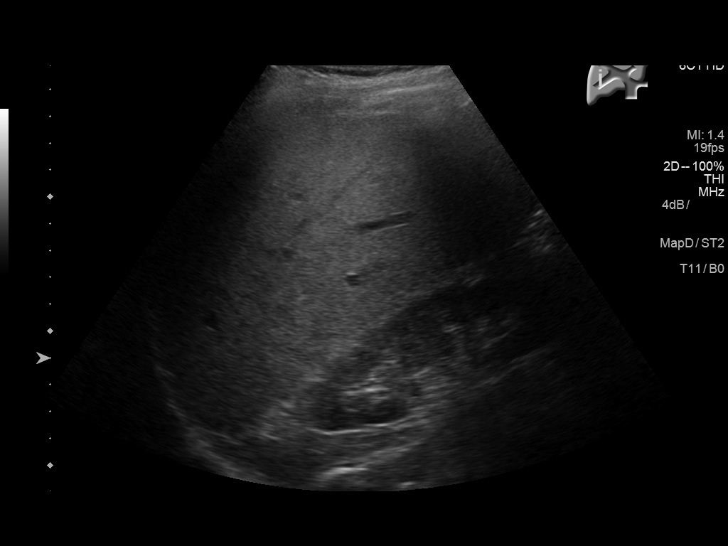
[im 38/57]
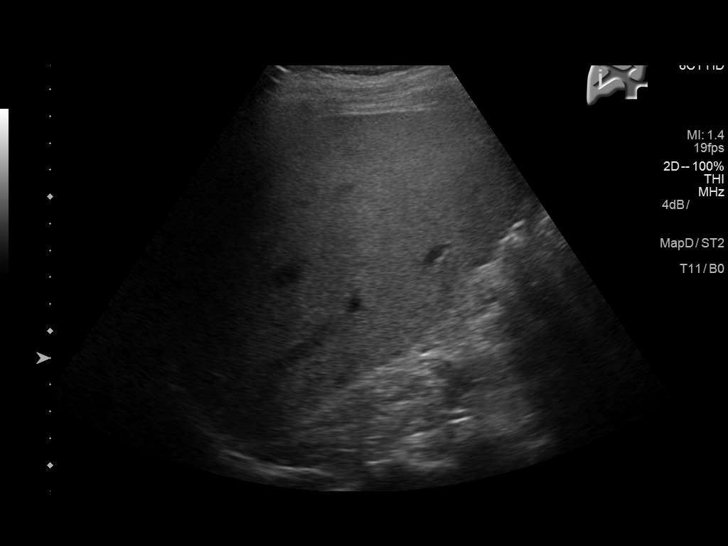
[im 43/57]
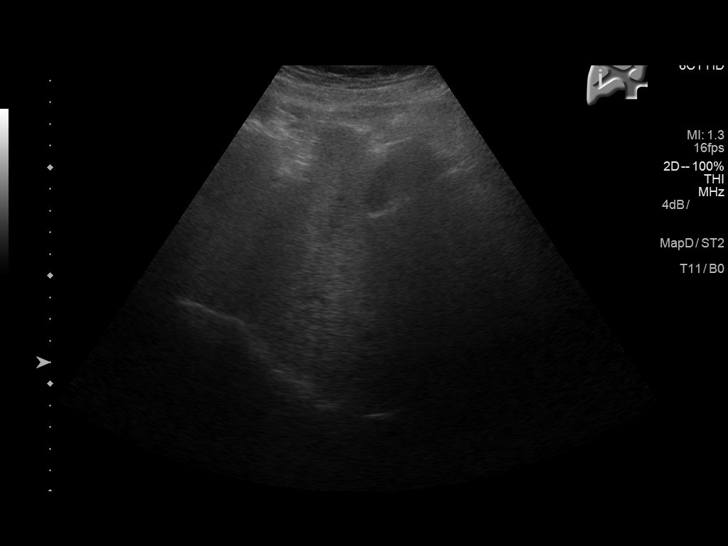
[im 47/57]
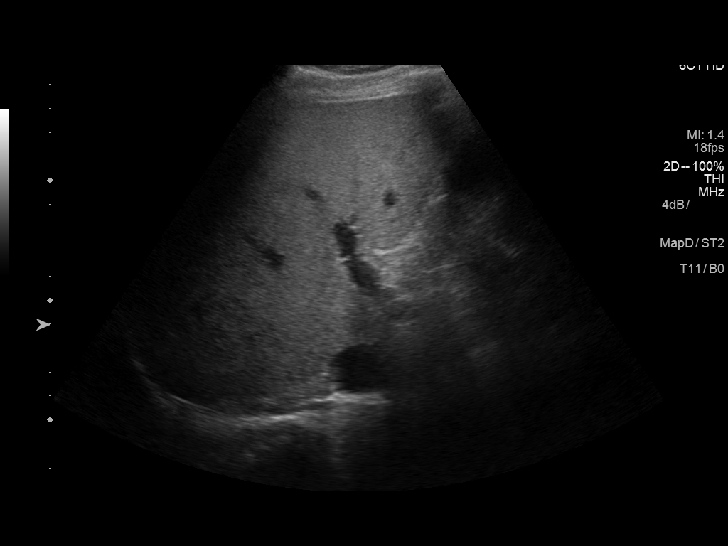
[im 52/57]
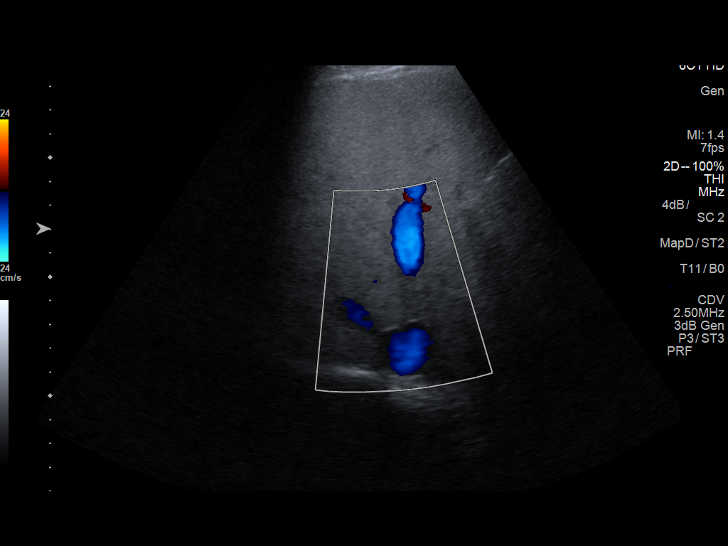
[im 57/57]
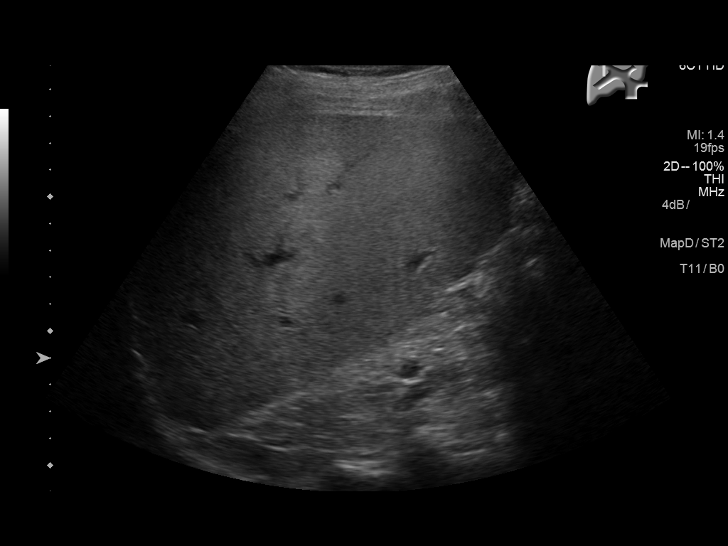

[14 of 25 positions shown; findings below may reference images not displayed]

FINDINGS: Gallbladder:

No gallstones or wall thickening visualized. No sonographic Murphy
sign noted by sonographer.

Common bile duct:

Diameter: 4 mm

Liver:

There is echogenic consistent with fatty infiltration and/or
hepatocellular disease. No focal hepatic abnormality.
IMPRESSION: Liver is echogenic consistent fatty infiltration and/or
hepatocellular disease. No focal hepatic abnormality identified.
